# Patient Record
Sex: Female | Born: 2000 | Hispanic: Refuse to answer
Health system: Midwestern US, Community
[De-identification: ages and names within clinical notes are randomized; demographics above are authoritative.]

---
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
## 2020-06-26 NOTE — ED Notes (Signed)
ED Patient Education Note     Patient Education Materials Follows:                     COVID-19 Testing, Precautions, & Infection  q Your COVID-19 lab test result is PENDING.   Your COVID-19 lab result is not available at this time. Please allow up to 2 days for processing Continue to isolate until you get your results or unless you have a confirmed negative result (not detected). All results can be viewed in the portal, If your result is positive (detected) you will also receive a phone call from [facility]. [employer]. Please follow all other discharge instructions given to you by your healthcare provider.    **** The quickest way to view or print your own test result is by the 'Patient Portal' at [email or web address] see next page for further 'Patient Portal' details.  Note: In reviewing your COVID-19 result on the patient portal (under the 'results' tab) you may notice a variation in the test name that includes, "Coronavirus", "cNoV", "Corona", "SARS", or "COV2". These all relate to a COVID-19 test that you had performed at a [facility]. [employer] location.  How to Access the [facility] Patient Portal  1. Go to: [email or web address]  2. You want the option of:  [facility]   If you do not have an account and are visiting the portal for the first time, select "Sign up now".   If you already have an account, select "Log into [facility]".       3. If you are signing up for a new account you will fill out a "Self-Enrollment for [facility]" page in which you will securely enter your first, last name, date of birth, social security number, and an identity verification prompt. *Tip:  when filling out the "Self-Enrollment for [facility]" enter your name using the same spelling that is on file with your physician's office or hospital. Once this page is filled out, hit the purple "Next" button at the bottom of the page.   4. Verify your information and click the purple  "Next Create Your Account" button.   5. After identity verification, you will create your username and password for your "[facility]" Patient Portal. You will then click the green "Create Account" button.   6. Once your account has been created, you will be taken back to a login screen. Log in with the username and password that you created in step 5.     ! If you need additional assistance with the [facility] Patient Portal, you may call the [facility] at [phone].      What does self-isolate mean?   Avoid leaving the house unless seeking healthcare treatment. Things that you should NOT do include:  work, school, church, restaurants, and social gatherings   Do not share utensils, drinking glasses, towels, or bedding with other people   Wash/sanitize your hands frequently and avoid touching your face.   Frequently clean "high-touch" surfaces (e.g. counter tops, doorknobs, bathroom fixtures, phones, tablets, and keyboards).   Try to remain 6 feet away from others as much as possible.   Avoid sharing confined spaces with others as much as possible.  If you live in your home with other people, consider keeping a separate bedroom and bathroom just for your use.  What If My Symptoms Get Worse?  Follow-up with your doctor or return to the ER for any difficulty breathing or other  worsening symptoms.  You may also see a doctor from home by going to [email or web address]      CDC COVID-19 Resources   For more information, visit.  [email or web address]       COVID-Related Education from the CDC  (Scan these codes with an Apple or Android device)           Prevent the spread of    What you should know  COVID-19 if you are sick:  about COVID-19 to        protect yourself and        others:                                                         COVID-19: Quarantine       10 things you can do to  vs. Isolation:         manage your COVID-19              symptoms at home:                                     What is COVID-19?  COVID-19 stands for "coronavirus disease 2019." It is caused by a virus called SARS-CoV-2. The virus first appeared in late 2019 and quickly spread around the world.  People with COVID-19 can have fever, cough, trouble breathing, and other symptoms. Problems with breathing happen when the infection affects the lungs and causes pneumonia.  Most people who get COVID-19 will not get severely ill. But some do. In many areas, people have been told to stay home and away from other people. This is to try to slow the spread of the virus.  How is COVID-19 Spread?    The virus that causes COVID-19 mainly spreads from person to person. This usually happens when an infected person coughs, sneezes, or talks near other people. The virus can be passed easily between people who live together. But it can also spread at gatherings where people are talking close together, shaking hands, hugging, sharing food, or even singing together. Doctors also think it is possible to get sick if you touch a surface that has the virus on it and then touch your mouth, nose, or eyes.  A person can be infected, and spread the virus to others, even without having any symptoms. This is why keeping people apart is one of the best ways to slow the spread.  It is also possible for the virus to spread from an infected person to an animal, like a pet. But this seems to be uncommon. There is no evidence that a person could get the virus from a pet.  Experts do not think the virus is spread through food like some other viruses. There is also no evidence that it can be spread through the water in a pool or hot tub. But because the virus can spread when people are close together, things like swimming in a crowded pool are still risky.  What are the Symptoms of COVID-19?    Symptoms usually start 4 or 5 days after a person is infected with the virus. But in some people, it can take up to 2  weeks for symptoms to appear. Some people never  show symptoms at all.  When symptoms do happen, they can include:    Fever   Cough   Trouble breathing   Feeling tired   Shaking chills   Muscle aches   Headache   Sore throat   Problems with sense of smell or taste         Some people have digestive problems like nausea or diarrhea. There have also been some reports of rashes or other skin symptoms. For example, some people with COVID-19 get reddish-purple spots on their fingers or toes. But it's not clear why or how often this happens.  For most people, symptoms will get better within a few weeks. But in others, COVID-19 can lead to serious problems like pneumonia, not getting enough oxygen, heart problems, or even death. This risk gets higher as people get older. It is also higher in people who have other health problems like serious heart disease, chronic kidney disease, type 2 diabetes, chronic obstructive pulmonary disease (COPD), sickle cell disease, or obesity. People who have a weak immune system for other reasons (for example, HIV infection or certain medicines), asthma, cystic fibrosis, type 1 diabetes, or high blood pressure might also be at higher risk for serious problems.    What should I do if I have symptoms?  If you have a fever, cough, trouble breathing, or other symptoms of COVID-19, call your doctor or nurse. They will ask about your symptoms. They might also ask about any recent travel and whether you have been around anyone who might be sick.  If your symptoms are not severe, it is best to call before you go in. The staff can tell you what to do and whether you need to be seen in person. Many people with only mild symptoms should stay home and avoid other people until they get better. If you do need to go to the clinic or hospital, cover your nose and mouth with cloth. This helps protect other people. The staff might also have you wait someplace away from other people.  If you are  severely ill and need to go to the clinic or hospital right away, you should still call ahead if possible. This way the staff can care for you while taking steps to protect others. If you think you are having a medical emergency, dial 9-1-1.  Is there a test for the virus that causes COVID-19?  Yes. If your doctor or nurse suspects you have COVID-19, they might take a swab from inside your nose for testing. If you are coughing up mucus, they might also test a sample of the mucus. These tests can help your doctor figure out if you have COVID-19 or another illness.  In some areas, it might not be possible to test everyone who might have been exposed to the virus. If your doctor cannot test you, they might tell you to stay home, avoid other people, and call if your symptoms get worse.  There is also a blood test that can show if a person has had COVID-19 in the past. This is called an "antibody" test. Over time, this could help experts understand how many people were infected without knowing it. Experts are also using blood tests to study whether a person who has had COVID-19 could get it again.  How is COVID-19 treated?  Many people will be able to stay home while they get better. But people with serious symptoms or other health problems might need to go to the hospital.  Mild illness - Mild illness means you might have symptoms like fever and cough, but you do not have trouble breathing. Most people with COVID-19 have mild illness and can rest at home until they get better. This usually takes about 2 weeks, but it's not the same for everyone.  If you are recovering from COVID-19, it's important to stay home and "self-isolate" until your doctor or nurse tells you it's safe to go back to your normal activities. Self-isolation means staying apart from other people, even the people you live with. When you can stop self-isolation will depend on how long it has been since you had symptoms, and in some cases, whether you  have had a negative test (showing that the virus is no longer in your body).  Severe illness - If you have more severe illness with trouble breathing, you might need to stay in the hospital, possibly in the intensive care unit (also called the "ICU"). While you are there, you will most likely be in a special isolation room. Only medical staff will be allowed in the room, and they will have to wear special gowns, gloves, masks, and eye protection.The doctors and nurses can monitor and support your breathing and other body functions and make you as comfortable as possible. You might need extra oxygen to help you breathe easily. If you are having a very hard time breathing, you might need to be put on a ventilator. This is a machine to help you breathe.  Doctors are studying several possible treatments for COVID-19. In certain cases, doctors might recommend medicines that seem to help some people who are severely ill. They also might recommend being part of a clinical trial. A clinical trial is a scientific study that tests new medicines to see how well they work. Do not try any new medicines or treatments without talking to a doctor.  Can COVID-19 be prevented?  There is not yet a vaccine to prevent COVID-19. But there are things you can do to help slow the spread. These steps are a good idea for everyone, especially in areas where the infection has spread very quickly. But they are extra important for people who are older or who have other health problems.  To help protect yourself and others:  Practice "social distancing." It's most important to avoid contact with people who are sick. But social distancing also means staying away from all people who do not live in your household. It is sometimes called "physical distancing."  Avoiding crowds is an important part of social distancing. But even small gatherings can be risky, so it's best to stay home as much as you can. When you do need to go out, try your best to  stay at least 6 feet (about 2 meters) away from other people.  Wear a cloth face mask when you need to go out. Experts in many countries recommend doing this. It is mostly so that if you are sick, even if you don't have any symptoms, you are less likely to spread the infection to other people. You can use a cloth or homemade mask to cover your mouth and nose. In most cases, experts recommend leaving medical masks for health workers.  When you take your cloth mask off, make sure you do not touch your eyes, nose, or mouth. And wash your hands after you touch the mask. You can wash the cloth mask with the rest of your laundry.  Wash your hands with soap and water often and  Avoid touching your face, especially your mouth, nose, and eyes. This is especially important after being out in public, getting your mail, or touching packages or other deliveries. The risk of getting infected by touching items like this is not well known, but is probably not very high. Still, it's a good idea to wash your hands often.    Make sure to rub your hands with soap for at least 20 seconds, cleaning your wrists, fingernails, and in between your fingers. Then rinse your hands and dry them with a paper towel you can throw away. If you are not near a sink, you can use a hand sanitizing gel to clean your hands. The gels with at least 60 percent alcohol work the best. But it is better to wash with soap and water if you can.  Avoid traveling if you can. Some experts recommend not traveling to or from certain areas where there are a lot of cases of COVID-19. But any form of travel, especially if you spend time in crowded places like airports, increases your risk. If lots of people travel, it also makes it more likely that the virus will spread to more parts of the world.    Why is social distancing so important?  Keeping people away from each other is one of the best ways to control the spread of the virus that causes COVID-19. That's because the  virus can spread easily through close contact, and it's not always possible to know who is infected.  Different areas have different rules. In many places, schools and businesses are closed, and events have been canceled or postponed. But social distancing is not just about avoiding big crowds. The safest thing to do is to avoid any gatherings with people from outside your household, even in small groups. Many people find it helpful to stay in touch with friends and relatives in other ways, like over the phone or online. If you have outdoor space, or can take a walk without getting near other people, it can also help to get fresh air when you are able.  When experts recommend staying home, it's important to take this seriously and follow instructions as best you can. If you do need to be around other people, keep in mind that:   The virus can spread both indoors and outdoors. But being outdoors is probably less risky.   The more people you come into contact with, and the more often you do this, the higher the risk of spreading the virus.   Washing your hands often, staying 6 feet (2 meters) away from people, and wearing a cloth mask will all help lower the risk to you and others.  It's hard having to change your life and habits, and it's normal to want things to get back to the way they used to be. But remember, even if you do not get very sick from COVID-19, you could still spread it to others who could get very sick. If people stop social distancing too soon, more people will get sick.  What should I do if someone in my home has COVID-19?  If someone in your home has COVID-19, there are additional things you can do to protect yourself and others:   Keep the sick person away from others - The sick person should stay in a separate room, and use a different bathroom if possible. They should also eat in their own room.   Experts also recommend that the person stay away from  pets in the house until they are  better.   Have them cover their face - The sick person should cover their nose and mouth with a cloth mask when they are in the same room as other people. If they can't use a face cover, you can help protect yourself by covering your face when you are in the room with them.   Wash hands [employer] your hands with soap and water often (see above).   Clean often - Here are some specific things that can help:  Wear disposable gloves when you clean. It's also a good idea to wear gloves when you have to touch the sick person's laundry, dishes, utensils, or trash.  When you do the sick person's laundry, avoid letting dirty clothes or bedding touch your body. Wash your hands and clean the outside of the washer after putting in the laundry.  Regularly clean things that are touched a lot. This includes counters, bedside tables, doorknobs, computers, phones, and bathroom surfaces.  Clean things in your home with soap and water, but also use disinfectants on appropriate surfaces. Some cleaning products work well to kill bacteria, but not viruses, so it's important to check labels.

## 2020-06-26 NOTE — ED Notes (Signed)
ED Triage Note       ED Triage Adult Entered On:  06/26/2020 12:16 EST    Performed On:  06/26/2020 12:14 EST by DEMEO, RN, ANTHONY M               Triage   Numeric Rating Pain Scale :   10 = Worst possible pain   Chief Complaint :   pt c/o HA, cough, nasal congestion x5days. denies known sick contacts. pt improving with otc medicaitons. ambulatory, in NAD   [place] Mode of Arrival :   Private vehicle   Infectious Disease Documentation :   Document assessment   Temperature Oral :   36.9 degC(Converted to: 98.4 degF)    Heart Rate Monitored :   97 bpm   Respiratory Rate :   20 br/min   Systolic Blood Pressure :   156 mmHg (HI)    Diastolic Blood Pressure :   92 mmHg (HI)    SpO2 :   100 %   Oxygen Therapy :   Room air   Patient presentation :   None of the above   Chief Complaint or Presentation suggest infection :   No   Weight Dosing :   67.7 kg(Converted to: 149 lb 4 oz)    Height :   165 cm(Converted to: 5 ft 5 in)    Body Mass Index Dosing :   25 kg/m2   DEMEO, RN, Garlan Fillers - 06/26/2020 12:14 EST   DCP GENERIC CODE   Tracking Acuity :   4   Tracking Group :   ED [employer], RN, Garlan Fillers - 06/26/2020 12:14 EST   ED General Section :   Document assessment   Pregnancy Status :   Patient denies   Last Menstrual Period :   06/20/2020 EST   ED Allergies Section :   Document assessment   ED Reason for Visit Section :   Document assessment   DEMEO, RN, Garlan Fillers - 06/26/2020 12:14 EST   ID Risk Screen Symptoms   Close Contact with COVID-19 ID :   No   Last 14 days COVID-19 ID :   Yes - Not Detected (negative)   DEMEO, RN, ANTHONY M - 06/26/2020 12:14 EST   Allergies   (As Of: 06/26/2020 12:16:39 EST)   Allergies (Active)   iodine containing compounds  Estimated Onset Date:   Unspecified ; Reactions:   Throat swelling ; Created By:   Maris Berger, RN, ANTHONY M; Reaction Status:   Active ; Category:   Drug ; Substance:   iodine containing compounds ; Type:   Allergy ; Severity:   Unknown ; Updated By:   Maris Berger,  RN, Garlan Fillers; Reviewed Date:   06/26/2020 12:15 EST        Psycho-Social   Last 3 mo, thoughts killing self/others :   Patient denies   Right click within box for Suspected Abuse policy link. :   None   Feels Safe Where Live :   Yes   ED Behavioral Activity Rating Scale :   4 - Quiet and awake (normal level of activity)   DEMEO, RN, Garlan Fillers - 06/26/2020 12:14 EST   ED Reason for Visit   (As Of: 06/26/2020 12:16:39 EST)   Diagnoses(Active)    Cough  Date:   06/26/2020 ; Diagnosis Type:   Reason For Visit ; Confirmation:   Complaint of ; Clinical Dx:   Cough ; Classification:  Medical ; Clinical Service:   Emergency medicine ; Code:   PNED ; Probability:   0 ; Diagnosis Code:   [id number]

## 2020-06-26 NOTE — ED Provider Notes (Signed)
Cough        Patient:   Lauren Black, Lauren Black            MRN: [id number]            FIN: [id number]               Age:   20 years     Sex:  Female     DOB:  2000/09/05   Associated Diagnoses:   URI (upper respiratory infection); Mild asthma exacerbation   Author:   Angeline Slim      Basic Information   Time seen: Provider Seen (ST)   ED Provider/Time:    Arayah Krouse,  Quandarius Nill-PA / 06/26/2020 12:27  .   Additional information: Chief Complaint from Nursing Triage Note   Chief Complaint  Chief Complaint: pt c/o HA, cough, nasal congestion x5days. denies known sick contacts. pt improving with otc medicaitons. ambulatory, in NAD (06/26/20 12:14:00).      History of Present Illness   20 year old female with a history of asthma presents for evaluation of cough, nasal congestion, headache, chills for 5 days.  She does not endorse increased wheezing and increased inhaler use.  Symptoms are worse at night.  She denies fever, nausea or vomiting, abdominal pain, constipation, sore throat.  No known sick contacts.  She is not vaccinated for COVID.  She is been taking over-the-counter medications which temporarily relieve her symptoms..        Review of Systems   Constitutional symptoms:  No fever, no chills.    Skin symptoms:  No rash,    ENMT symptoms:  Nasal congestion.   Respiratory symptoms:  Cough, No shortness of breath,    Cardiovascular symptoms:  No chest pain, no palpitations.    Gastrointestinal symptoms:  No abdominal pain, no nausea, no vomiting.    Neurologic symptoms:  Headache, No dizziness,              Additional review of systems information: All systems reviewed as documented in chart.      Health Status   Allergies:    Allergic Reactions (Selected)  Unknown  Iodine containing compounds- Throat swelling..      Past Medical/ Family/ Social History   Problem list:    No qualifying data available  .      Physical Examination               Vital Signs   Vital Signs   06/26/2020 12:14 EST Systolic Blood Pressure 156 mmHg  HI     Diastolic Blood Pressure 92 mmHg  HI    Temperature Oral 36.9 degC    Heart Rate Monitored 97 bpm    Respiratory Rate 20 br/min    SpO2 100 %   .   Measurements   06/26/2020 12:16 EST Body Mass Index est meas 24.87 kg/m2    Body Mass Index Measured 24.87 kg/m2   06/26/2020 12:14 EST Height/Length Measured 165 cm    Weight Dosing 67.7 kg   .   Basic Oxygen Information   06/26/2020 12:14 EST Oxygen Therapy Room air    SpO2 100 %   .   General:  Alert, no acute distress.    Skin:  Warm, dry.    Head:  Normocephalic, atraumatic.    Cardiovascular:  Regular rate and rhythm, No murmur.    Respiratory:  Lungs are clear to auscultation, respirations are non-labored, breath sounds are equal.    Neurological:  Alert and oriented to  person, place, time, and situation.   Lymphatics:  No lymphadenopathy.   Psychiatric:  Cooperative, appropriate mood & affect.       Medical Decision Making   Rationale:  20 year old female with history of asthma presents with 5 days of upper respiratory symptoms and increased inhaler use.  On exam she is resting comfortably, no respiratory distress, her vitals are stable besides mild hypertension.  Her exam is benign and no wheezing or decreased breath sounds appreciated.  She has not been vaccinated for COVID.  She will be tested today and results pending at time of discharge.  We will also treat her with a burst of steroids for a mild asthma exacerbation.  Advised to continue using her inhaler as needed.  Follow-up with primary care and return to ER precautions are reviewed.  She expressed understanding all questions were answered..   Documents reviewed:  Emergency department nurses' notes.      Impression and Plan   Diagnosis   URI (upper respiratory infection) (ICD10-CM J06.9, Discharge, Medical)   Mild asthma exacerbation (ICD10-CM J45.901, Discharge, Medical)   Plan   Condition: Stable.    Disposition: Discharged: Time  06/26/2020 12:35:00, to home.    Prescriptions: [facility] prescriptions    Pharmacy:  Tessalon 200 mg oral capsule (Prescribe): 200 mg, 1 caps, Oral, TID, for 7 days, PRN: as needed for cough, 21 caps, 0 Refill(s)  predniSONE 20 mg oral tablet (Prescribe): 40 mg, 2 tabs, Oral, Daily, for 5 days, 10 tabs, 0 Refill(s).    Patient was given the following educational materials:  COVID Discharge Instructions Pending (Custom updated 06/02/20) (Custom).    Follow up with: PCP or clinic Within 3 to 5 days Follow-up with primary care provider in 3-5 days f symptoms worsening or not improving.  Return to ED if symptoms worsen..    Counseled: Patient, Regarding diagnosis, Regarding diagnostic results, Regarding treatment plan, Patient indicated understanding of instructions.    Signature Line     Electronically Signed on 06/26/2020 12:46 PM EST   ________________________________________________   Angeline Slim      Electronically Signed on 06/26/2020 02:05 PM EST   ________________________________________________   Rennis Golden Y-MD

## 2020-06-26 NOTE — ED Notes (Signed)
 ED Patient Summary                 [facility]  [address], [place], [place] [address]  [id number]  Discharge Instructions (Patient)  Lauren Black, Lauren Black  DOB:  [birth year]                   MRN: [id number]                   FIN: [phone]  Reason For Visit: Cough; HA/CONGESTED/STUFFY NOSE/SORE THRT  Final Diagnosis: Mild asthma exacerbation; URI (upper respiratory infection)     Visit Date: 06/26/2020 11:57:00  Address: [address] [place] [address]  Phone: [phone]     Emergency Department Providers:         Primary Physician:      NICHOLAUS FRED Alray Lionel would like to thank you for allowing us  to assist you with your healthcare needs. The following includes patient education materials and information regarding your injury/illness.     Follow-up Instructions:  You were seen today on an emergency basis. Please contact your primary care doctor for a follow up appointment. If you received a referral to a specialist doctor, it is important you follow-up as instructed.    It is important that you call your follow-up doctor to schedule and confirm the location of your next appointment. Your doctor may practice at multiple locations. The office location of your follow-up appointment may be different to the one written on your discharge instructions.    If you do not have a primary care doctor, please call [phone] for help in finding a [facility] Provider. For help in finding a specialist doctor, please call [phone].    If your condition gets worse before your follow-up with your primary care doctor or specialist, please return to the Emergency Department.      Coronavirus 2019 (COVID-19) Reminders:     Patients age 21 - 33, with parental consent, and over age 74 can make an appointment for a COVID-19 vaccine. Patients can contact their [facility] doctors' offices to schedule an appointment to  receive the COVID-19 vaccine. Patients who do not have a [facility] physician can call [phone] to schedule vaccination appointments.      Follow Up Appointments:  Primary Care Provider:     Name: PCP,  NONE     Phone:                  With: Address: When:   PCP or clinic  Within 3 to 5 days   Comments:   Follow-up with primary care provider in 3-5 days f symptoms worsening or not improving. Return to ED if symptoms worsen.                   New Medications  Printed Prescriptions  benzonatate (Tessalon 200 mg oral capsule) 1 Capsules Oral (given by mouth) 3 times a day as needed as needed for cough for 7 Days. Refills: 0.  Last Dose:____________________  predniSONE (predniSONE 20 mg oral tablet) 2 Tabs Oral (given by mouth) every day for 5 Days. Refills: 0.  Last Dose:____________________      Allergy Info: iodine containing compounds     Discharge Additional Information          Discharge Patient 06/26/20 12:42:00 EST      Patient Education Materials:  COVID-19 Testing, Precautions, & Infection  q Your COVID-19 lab test result is PENDING.   Your COVID-19 lab result is not available at this time. Please allow up to 2 days for processing Continue to isolate until you get your results or unless you have a confirmed negative result (not detected). All results can be viewed in the portal, If your result is positive (detected) you will also receive a phone call from [facility] [employer]. Please follow all other discharge instructions given to you by your healthcare provider.    **** The quickest way to view or print your own test result is by the 'Patient Portal' at [email or web address] see next page for further 'Patient Portal' details.  Note: In reviewing your COVID-19 result on the patient portal (under the 'results' tab) you may notice a variation in the test name that includes, "Coronavirus", "cNoV", "Corona", "SARS", or "COV2". These all relate to a COVID-19 test  that you had performed at a [facility] location.  How to Access the [facility] Patient Portal  1. Go to: [email or web address]  2. You want the option of:  [facility]   If you do not have an account and are visiting the portal for the first time, select "Sign up now".   If you already have an account, select "Log into [facility]".       3. If you are signing up for a new account you will fill out a "Self-Enrollment for [facility]" page in which you will securely enter your first, last name, date of birth, social security number, and an identity verification prompt. *Tip:  when filling out the "Self-Enrollment for [facility]" enter your name using the same spelling that is on file with your physician's office or hospital. Once this page is filled out, hit the purple "Next" button at the bottom of the page.   4. Verify your information and click the purple "Next Create Your Account" button.   5. After identity verification, you will create your username and password for your "[facility]" Patient Portal. You will then click the green "Create Account" button.   6. Once your account has been created, you will be taken back to a login screen. Log in with the username and password that you created in step 5.     ! If you need additional assistance with the [facility] Patient Portal, you may call the [facility] at [phone].      What does self-isolate mean?   Avoid leaving the house unless seeking healthcare treatment. Things that you should NOT do include:  work, school, church, restaurants, and social gatherings   Do not share utensils, drinking glasses, towels, or bedding with other people   Wash/sanitize your hands frequently and avoid touching your face.   Frequently clean "high-touch" surfaces (e.g. counter tops, doorknobs, bathroom fixtures, phones, tablets, and keyboards).   Try to remain 6 feet away from others as  much as possible.   Avoid sharing confined spaces with others as much as possible.  If you live in your home with other people, consider keeping a separate bedroom and bathroom just for your use.  What If My Symptoms Get Worse?  Follow-up with your doctor or return to the ER for any difficulty breathing or other worsening symptoms.  You may also see a doctor from home by going to [email or web address]      CDC COVID-19 Resources   For more information, visit.  [email or web address]se  COVID-Related Education from the [employer]  (Scan these codes with an Apple or Android device)           Prevent the spread of    What you should know  COVID-19 if you are sick:  about COVID-19 to        protect yourself and        others:                                                         COVID-19: Quarantine       10 things you can do to  vs. Isolation:         manage your COVID-19             symptoms at home:                                     What is COVID-19?  COVID-19 stands for coronavirus disease 2019. It is caused by a virus called SARS-CoV-2. The virus first appeared in late 2019 and quickly spread around the world.  People with COVID-19 can have fever, cough, trouble breathing, and other symptoms. Problems with breathing happen when the infection affects the lungs and causes pneumonia.  Most people who get COVID-19 will not get severely ill. But some do. In many areas, people have been told to stay home and away from other people. This is to try to slow the spread of the virus.  How is COVID-19 Spread?    The virus that causes COVID-19 mainly spreads from person to person. This usually happens when an infected person coughs, sneezes, or talks near other people. The virus can be passed easily between people who live together. But it can also spread at gatherings where people are talking close together, shaking hands, hugging, sharing food, or even singing together. Doctors also think it is  possible to get sick if you touch a surface that has the virus on it and then touch your mouth, nose, or eyes.  A person can be infected, and spread the virus to others, even without having any symptoms. This is why keeping people apart is one of the best ways to slow the spread.  It is also possible for the virus to spread from an infected person to an animal, like a pet. But this seems to be uncommon. There is no evidence that a person could get the virus from a pet.  Experts do not think the virus is spread through food like some other viruses. There is also no evidence that it can be spread through the water in a pool or hot tub. But because the virus can spread when people are close together, things like swimming in a crowded pool are still risky.  What are the Symptoms of COVID-19?    Symptoms usually start 4 or 5 days after a person is infected with the virus. But in some people, it can take up to 2 weeks for symptoms to appear. Some people never show symptoms at all.  When symptoms do happen, they can include:    Fever   Cough   Trouble breathing   Feeling tired   Shaking chills   Muscle aches   Headache  Sore throat   Problems with sense of smell or taste         Some people have digestive problems like nausea or diarrhea. There have also been some reports of rashes or other skin symptoms. For example, some people with COVID-19 get reddish-purple spots on their fingers or toes. But it's not clear why or how often this happens.  For most people, symptoms will get better within a few weeks. But in others, COVID-19 can lead to serious problems like pneumonia, not getting enough oxygen, heart problems, or even death. This risk gets higher as people get older. It is also higher in people who have other health problems like serious heart disease, chronic kidney disease, type 2 diabetes, chronic obstructive pulmonary disease (COPD), sickle cell disease, or obesity. People who have a weak immune system  for other reasons (for example, HIV infection or certain medicines), asthma, cystic fibrosis, type 1 diabetes, or high blood pressure might also be at higher risk for serious problems.    What should I do if I have symptoms?  If you have a fever, cough, trouble breathing, or other symptoms of COVID-19, call your doctor or nurse. They will ask about your symptoms. They might also ask about any recent travel and whether you have been around anyone who might be sick.  If your symptoms are not severe, it is best to call before you go in. The staff can tell you what to do and whether you need to be seen in person. Many people with only mild symptoms should stay home and avoid other people until they get better. If you do need to go to the clinic or hospital, cover your nose and mouth with cloth. This helps protect other people. The staff might also have you wait someplace away from other people.  If you are severely ill and need to go to the clinic or hospital right away, you should still call ahead if possible. This way the staff can care for you while taking steps to protect others. If you think you are having a medical emergency, dial 9-1-1.  Is there a test for the virus that causes COVID-19?  Yes. If your doctor or nurse suspects you have COVID-19, they might take a swab from inside your nose for testing. If you are coughing up mucus, they might also test a sample of the mucus. These tests can help your doctor figure out if you have COVID-19 or another illness.  In some areas, it might not be possible to test everyone who might have been exposed to the virus. If your doctor cannot test you, they might tell you to stay home, avoid other people, and call if your symptoms get worse.  There is also a blood test that can show if a person has had COVID-19 in the past. This is called an antibody test. Over time, this could help experts understand how many people were infected without knowing it. Experts are also using  blood tests to study whether a person who has had COVID-19 could get it again.  How is COVID-19 treated?  Many people will be able to stay home while they get better. But people with serious symptoms or other health problems might need to go to the hospital.  Mild illness - Mild illness means you might have symptoms like fever and cough, but you do not have trouble breathing. Most people with COVID-19 have mild illness and can rest at home until they get better. This  usually takes about 2 weeks, but it's not the same for everyone.  If you are recovering from COVID-19, it's important to stay home and self-isolate until your doctor or nurse tells you it's safe to go back to your normal activities. Self-isolation means staying apart from other people, even the people you live with. When you can stop self-isolation will depend on how long it has been since you had symptoms, and in some cases, whether you have had a negative test (showing that the virus is no longer in your body).  Severe illness - If you have more severe illness with trouble breathing, you might need to stay in the hospital, possibly in the intensive care unit (also called the ICU). While you are there, you will most likely be in a special isolation room. Only medical staff will be allowed in the room, and they will have to wear special gowns, gloves, masks, and eye protection.The doctors and nurses can monitor and support your breathing and other body functions and make you as comfortable as possible. You might need extra oxygen to help you breathe easily. If you are having a very hard time breathing, you might need to be put on a ventilator. This is a machine to help you breathe.  Doctors are studying several possible treatments for COVID-19. In certain cases, doctors might recommend medicines that seem to help some people who are severely ill. They also might recommend being part of a clinical trial. A clinical trial is a scientific study that  tests new medicines to see how well they work. Do not try any new medicines or treatments without talking to a doctor.  Can COVID-19 be prevented?  There is not yet a vaccine to prevent COVID-19. But there are things you can do to help slow the spread. These steps are a good idea for everyone, especially in areas where the infection has spread very quickly. But they are extra important for people who are older or who have other health problems.  To help protect yourself and others:  Practice social distancing. It's most important to avoid contact with people who are sick. But social distancing also means staying away from all people who do not live in your household. It is sometimes called physical distancing.  Avoiding crowds is an important part of social distancing. But even small gatherings can be risky, so it's best to stay home as much as you can. When you do need to go out, try your best to stay at least 6 feet (about 2 meters) away from other people.  Wear a cloth face mask when you need to go out. Experts in many countries recommend doing this. It is mostly so that if you are sick, even if you don't have any symptoms, you are less likely to spread the infection to other people. You can use a cloth or homemade mask to cover your mouth and nose. In most cases, experts recommend leaving medical masks for health workers.  When you take your cloth mask off, make sure you do not touch your eyes, nose, or mouth. And wash your hands after you touch the mask. You can wash the cloth mask with the rest of your laundry.  Wash your hands with soap and water often and Avoid touching your face, especially your mouth, nose, and eyes. This is especially important after being out in public, getting your mail, or touching packages or other deliveries. The risk of getting infected by touching items like this is  not well known, but is probably not very high. Still, it's a good idea to wash your hands often.    Make sure to  rub your hands with soap for at least 20 seconds, cleaning your wrists, fingernails, and in between your fingers. Then rinse your hands and dry them with a paper towel you can throw away. If you are not near a sink, you can use a hand sanitizing gel to clean your hands. The gels with at least 60 percent alcohol work the best. But it is better to wash with soap and water if you can.  Avoid traveling if you can. Some experts recommend not traveling to or from certain areas where there are a lot of cases of COVID-19. But any form of travel, especially if you spend time in crowded places like airports, increases your risk. If lots of people travel, it also makes it more likely that the virus will spread to more parts of the world.    Why is social distancing so important?  Keeping people away from each other is one of the best ways to control the spread of the virus that causes COVID-19. That's because the virus can spread easily through close contact, and it's not always possible to know who is infected.  Different areas have different rules. In many places, schools and businesses are closed, and events have been canceled or postponed. But social distancing is not just about avoiding big crowds. The safest thing to do is to avoid any gatherings with people from outside your household, even in small groups. Many people find it helpful to stay in touch with friends and relatives in other ways, like over the phone or online. If you have outdoor space, or can take a walk without getting near other people, it can also help to get fresh air when you are able.  When experts recommend staying home, it's important to take this seriously and follow instructions as best you can. If you do need to be around other people, keep in mind that:   The virus can spread both indoors and outdoors. But being outdoors is probably less risky.   The more people you come into contact with, and the more often you do this, the higher the risk  of spreading the virus.   Washing your hands often, staying 6 feet (2 meters) away from people, and wearing a cloth mask will all help lower the risk to you and others.  It's hard having to change your life and habits, and it's normal to want things to get back to the way they used to be. But remember, even if you do not get very sick from COVID-19, you could still spread it to others who could get very sick. If people stop social distancing too soon, more people will get sick.  What should I do if someone in my home has COVID-19?  If someone in your home has COVID-19, there are additional things you can do to protect yourself and others:   Keep the sick person away from others - The sick person should stay in a separate room, and use a different bathroom if possible. They should also eat in their own room.   Experts also recommend that the person stay away from pets in the house until they are better.   Have them cover their face - The sick person should cover their nose and mouth with a cloth mask when they are in the same room as other  people. If they can't use a face cover, you can help protect yourself by covering your face when you are in the room with them.   Wash hands [employer] your hands with soap and water often (see above).   Clean often - Here are some specific things that can help:  Wear disposable gloves when you clean. It's also a good idea to wear gloves when you have to touch the sick person's laundry, dishes, utensils, or trash.  When you do the sick person's laundry, avoid letting dirty clothes or bedding touch your body. Wash your hands and clean the outside of the washer after putting in the laundry.  Regularly clean things that are touched a lot. This includes counters, bedside tables, doorknobs, computers, phones, and bathroom surfaces.  Clean things in your home with soap and water, but also use disinfectants on appropriate surfaces. Some cleaning products work well to kill bacteria, but  not viruses, so it's important to check labels.                            ---------------------------------------------------------------------------------------------------------------------  [facility]) encourages you to self-enroll in the [facility] Patient Portal.  [facility] Patient Portal will allow you to manage your personal health information securely from your own electronic device now and in the future.  To begin your Patient Portal enrollment process, please visit [email or web address] Click on "Sign up now" under [facility].  If you find that you need additional assistance on the [facility] Patient Portal or need a copy of your medical records, please call the [facility] Medical Records Office at [phone].  Comment:

## 2020-06-26 NOTE — Discharge Summary (Signed)
ED Clinical Summary                         [facility]  [address]  [place], [place], [id number]  [phone]           PERSON INFORMATION  Name: Lauren Black, Lauren Black Age:  [age] Years DOB: 05-14-2001   Sex: Female Language: English PCP: PCP,  NONE   Marital Status:  Single Phone: [phone] Med Service: MED-Medicine   MRN:  [id number] Acct# [id number] Arrival: 06/26/2020 11:57:00   Visit Reason: Cough; HA/CONGESTED/STUFFY NOSE/SORE THRT Acuity: 4 LOS: 000 00:51   Address:      [address] [place] [id number]  Diagnosis:      Mild asthma exacerbation; URI (upper respiratory infection)  Printed Prescriptions:            Allergies      iodine containing compounds (Throat swelling)      Medications Administered During Visit:        Patient Medication List:              benzonatate (Tessalon 200 mg oral capsule) 1 Capsules Oral (given by mouth) 3 times a day as needed as needed for cough for 7 Days. Refills: 0.  predniSONE (predniSONE 20 mg oral tablet) 2 Tabs Oral (given by mouth) every day for 5 Days. Refills: 0.         Major Tests and Procedures:  The following procedures and tests were performed during your ED visit.  COMMONPROCEDURES%>  COMMON PROCEDURESCOMMENTS%>          Laboratory Orders  Name Status Details   COVFlu COB Ordered Nasopharyngeal Swab, Stat, ST - Stat, 06/26/20 12:34:00 EST, Once, 06/26/20 12:34:00 EST, Nurse collect, DAVIS,  LUNDY-PA, Print label Y/N               Radiology Orders  No radiology orders were placed.              Patient Care Orders  Name Status Details   COVID-19 Status Ordered 06/26/20 12:34:03 EST, NOT VALID FOR pharmacy, laboratory, radiology., 06/26/20 12:34:03 EST, COVID-19 PUI - under investigation   Discharge Patient Ordered 06/26/20 12:42:00 EST   ED Assessment Adult Ordered 06/26/20 12:16:40 EST, 06/26/20 12:16:40 EST   ED Secondary Triage Ordered 06/26/20 12:16:40 EST, 06/26/20 12:16:40 EST   ED Triage Adult Completed 06/26/20  11:57:33 EST, 06/26/20 11:57:33 EST   Notify Provider Ordered 06/26/20 12:34:03 EST, This message can only be seen by Nursing, it is not visible to Pharmacy, Laboratory, or Radiology., 06/26/20 12:34:03 EST   Patient Isolation Ordered 06/26/20 12:34:00 EST, Contact and Airborne, Constant Indicator             PROVIDER INFORMATION               Provider Role Assigned Marva PandaUnassigned   DAVIS, LUNDY-PA ED MidLevel 06/26/2020 12:27:23    Rennis GoldenKWON, MATTHEW Y-MD ED Provider 06/26/2020 12:27:34 06/26/2020 12:27:37   Mort SawyersFLEMING, SAMUEL D-MD ED Provider 06/26/2020 12:28:00 06/26/2020 12:28:06   Timlin, RN, Truddie Hiddeniffany A ED Nurse 06/26/2020 12:38:21        Attending Physician:  Angeline SlimAVIS,  LUNDY-PA     Admit Doc  DAVIS,  LUNDY-PA     Consulting Doc       VITALS INFORMATION  Vital Sign Triage Latest   Temp Oral ORAL_1%>36.9 degC ORAL%>36.9 degC   Temp Temporal TEMPORAL_1%> TEMPORAL%>   Temp Intravascular INTRAVASCULAR_1%> INTRAVASCULAR%>  Temp Axillary AXILLARY_1%> AXILLARY%>   Temp Rectal RECTAL_1%> RECTAL%>   02 Sat 100 % 100 %   Respiratory Rate RATE_1%>20 br/min RATE%>20 br/min   Peripheral Pulse Rate PULSE RATE_1%> PULSE RATE%>   Apical Heart Rate HEART RATE_1%> HEART RATE%>   Blood Pressure BLOOD PRESSURE_1%>/ BLOOD PRESSURE_1%>92 mmHg BLOOD PRESSURE%>156 mmHg / BLOOD PRESSURE%>92 mmHg                 Immunizations      No Immunizations Documented This Visit          DISCHARGE INFORMATION   Discharge Disposition: H Outpt-Sent Home   Discharge Location:    Home   Discharge Date and Time:    06/26/2020 12:48:00   ED Checkout Date and Time:    06/26/2020 12:48:00     DEPART REASON INCOMPLETE INFORMATION               Depart Action Incomplete Reason   Interactive View/I&O Recently assessed               Problems      No Problems Documented              Smoking Status      No Smoking Status Documented         PATIENT EDUCATION INFORMATION  Instructions:        COVID Discharge Instructions Pending (Custom updated 06/02/20) (Custom)     Follow up:                     With: Address: When:   PCP or clinic  Within 3 to 5 days   Comments:   Follow-up with primary care provider in 3-5 days f symptoms worsening or not improving. Return to ED if symptoms worsen.           ED PROVIDER DOCUMENTATION     Patient:   Lauren Black, Lauren Black            MRN: [id number]            FIN: [id number]               Age:   20 years     Sex:  Female     DOB:  March 20, 2001   Associated Diagnoses:   URI (upper respiratory infection); Mild asthma exacerbation   Author:   Angeline Slim      Basic Information   Time seen: Provider Seen (ST)   ED Provider/Time:    DAVIS,  LUNDY-PA / 06/26/2020 12:27  .   Additional information: Chief Complaint from Nursing Triage Note   Chief Complaint  Chief Complaint: pt c/o HA, cough, nasal congestion x5days. denies known sick contacts. pt improving with otc medicaitons. ambulatory, in NAD (06/26/20 12:14:00).      History of Present Illness   20 year old female with a history of asthma presents for evaluation of cough, nasal congestion, headache, chills for 5 days.  She does not endorse increased wheezing and increased inhaler use.  Symptoms are worse at night.  She denies fever, nausea or vomiting, abdominal pain, constipation, sore throat.  No known sick contacts.  She is not vaccinated for COVID.  She is been taking over-the-counter medications which temporarily relieve her symptoms..        Review of Systems   Constitutional symptoms:  No fever, no chills.    Skin symptoms:  No rash,    ENMT symptoms:  Nasal congestion.   Respiratory symptoms:  Cough, No shortness of breath,    Cardiovascular symptoms:  No chest pain, no palpitations.    Gastrointestinal symptoms:  No abdominal pain, no nausea, no vomiting.    Neurologic symptoms:  Headache, No dizziness,              Additional review of systems information: All systems reviewed as documented in chart.      Health Status   Allergies:    Allergic Reactions (Selected)  Unknown  Iodine containing compounds-  Throat swelling..      Past Medical/ Family/ Social History   Problem list:    No qualifying data available  .      Physical Examination               Vital Signs   Vital Signs   06/26/2020 12:14 EST Systolic Blood Pressure 156 mmHg  HI    Diastolic Blood Pressure 92 mmHg  HI    Temperature Oral 36.9 degC    Heart Rate Monitored 97 bpm    Respiratory Rate 20 br/min    SpO2 100 %   .   Measurements   06/26/2020 12:16 EST Body Mass Index est meas 24.87 kg/m2    Body Mass Index Measured 24.87 kg/m2   06/26/2020 12:14 EST Height/Length Measured 165 cm    Weight Dosing 67.7 kg   .   Basic Oxygen Information   06/26/2020 12:14 EST Oxygen Therapy Room air    SpO2 100 %   .   General:  Alert, no acute distress.    Skin:  Warm, dry.    Head:  Normocephalic, atraumatic.    Cardiovascular:  Regular rate and rhythm, No murmur.    Respiratory:  Lungs are clear to auscultation, respirations are non-labored, breath sounds are equal.    Neurological:  Alert and oriented to person, place, time, and situation.   Lymphatics:  No lymphadenopathy.   Psychiatric:  Cooperative, appropriate mood & affect.       Medical Decision Making   Rationale:  20 year old female with history of asthma presents with 5 days of upper respiratory symptoms and increased inhaler use.  On exam she is resting comfortably, no respiratory distress, her vitals are stable besides mild hypertension.  Her exam is benign and no wheezing or decreased breath sounds appreciated.  She has not been vaccinated for COVID.  She will be tested today and results pending at time of discharge.  We will also treat her with a burst of steroids for a mild asthma exacerbation.  Advised to continue using her inhaler as needed.  Follow-up with primary care and return to ER precautions are reviewed.  She expressed understanding all questions were answered..   Documents reviewed:  Emergency department nurses' notes.      Impression and Plan   Diagnosis   URI (upper respiratory infection)  (ICD10-CM J06.9, Discharge, Medical)   Mild asthma exacerbation (ICD10-CM J45.901, Discharge, Medical)   Plan   Condition: Stable.    Disposition: Discharged: Time  06/26/2020 12:35:00, to home.    Prescriptions: Launch prescriptions   Pharmacy:  Tessalon 200 mg oral capsule (Prescribe): 200 mg, 1 caps, Oral, TID, for 7 days, PRN: as needed for cough, 21 caps, 0 Refill(s)  predniSONE 20 mg oral tablet (Prescribe): 40 mg, 2 tabs, Oral, Daily, for 5 days, 10 tabs, 0 Refill(s).    Patient was given the following educational materials:  COVID Discharge Instructions Pending (Custom updated 06/02/20) (Custom).    Follow up with:  PCP or clinic Within 3 to 5 days Follow-up with primary care provider in 3-5 days f symptoms worsening or not improving.  Return to ED if symptoms worsen..    Counseled: Patient, Regarding diagnosis, Regarding diagnostic results, Regarding treatment plan, Patient indicated understanding of instructions.

## 2020-06-27 LAB — COVID-19 & INFLUENZA COMBO (COBAS)
INFLUENZA A: NEGATIVE
INFLUENZA B: NEGATIVE
SARS-CoV-2: NEGATIVE
